# Patient Record
Sex: Male | Born: 1955 | Race: White | Hispanic: No | Marital: Married | Smoking: Current every day smoker
Health system: Southern US, Community
[De-identification: ages and names within clinical notes are randomized; demographics above are authoritative.]

## PROBLEM LIST (undated history)

## (undated) DIAGNOSIS — K08109 Complete loss of teeth, unspecified cause, unspecified class: Secondary | ICD-10-CM

## (undated) DIAGNOSIS — F17210 Nicotine dependence, cigarettes, uncomplicated: Secondary | ICD-10-CM

## (undated) DIAGNOSIS — H9190 Unspecified hearing loss, unspecified ear: Secondary | ICD-10-CM

## (undated) DIAGNOSIS — Z972 Presence of dental prosthetic device (complete) (partial): Secondary | ICD-10-CM

## (undated) DIAGNOSIS — C801 Malignant (primary) neoplasm, unspecified: Secondary | ICD-10-CM

## (undated) DIAGNOSIS — Z923 Personal history of irradiation: Secondary | ICD-10-CM

## (undated) HISTORY — PX: TONSILLECTOMY: SUR1361

## (undated) HISTORY — PX: MULTIPLE TOOTH EXTRACTIONS: SHX2053

---

## 2013-06-23 ENCOUNTER — Encounter (HOSPITAL_BASED_OUTPATIENT_CLINIC_OR_DEPARTMENT_OTHER): Payer: Self-pay | Admitting: *Deleted

## 2013-06-23 NOTE — Progress Notes (Signed)
Pt not sure if he can have surgery-needs to talk with wife about money-will call back

## 2013-06-25 ENCOUNTER — Ambulatory Visit (HOSPITAL_BASED_OUTPATIENT_CLINIC_OR_DEPARTMENT_OTHER): Payer: BC Managed Care – PPO | Admitting: Anesthesiology

## 2013-06-25 ENCOUNTER — Ambulatory Visit (HOSPITAL_BASED_OUTPATIENT_CLINIC_OR_DEPARTMENT_OTHER)
Admission: RE | Admit: 2013-06-25 | Discharge: 2013-06-25 | Disposition: A | Discharge status: Home / Self Care | Payer: BC Managed Care – PPO | Source: Ambulatory Visit | Attending: Otolaryngology | Admitting: Otolaryngology

## 2013-06-25 ENCOUNTER — Encounter (HOSPITAL_BASED_OUTPATIENT_CLINIC_OR_DEPARTMENT_OTHER)
Admission: RE | Disposition: A | Discharge status: Home / Self Care | Payer: Self-pay | Source: Ambulatory Visit | Attending: Otolaryngology

## 2013-06-25 ENCOUNTER — Encounter (HOSPITAL_BASED_OUTPATIENT_CLINIC_OR_DEPARTMENT_OTHER): Payer: BC Managed Care – PPO | Admitting: Anesthesiology

## 2013-06-25 ENCOUNTER — Encounter (HOSPITAL_BASED_OUTPATIENT_CLINIC_OR_DEPARTMENT_OTHER): Payer: Self-pay | Admitting: *Deleted

## 2013-06-25 DIAGNOSIS — J449 Chronic obstructive pulmonary disease, unspecified: Secondary | ICD-10-CM | POA: Insufficient documentation

## 2013-06-25 DIAGNOSIS — J4489 Other specified chronic obstructive pulmonary disease: Secondary | ICD-10-CM | POA: Insufficient documentation

## 2013-06-25 DIAGNOSIS — H9190 Unspecified hearing loss, unspecified ear: Secondary | ICD-10-CM | POA: Insufficient documentation

## 2013-06-25 DIAGNOSIS — C801 Malignant (primary) neoplasm, unspecified: Secondary | ICD-10-CM | POA: Insufficient documentation

## 2013-06-25 DIAGNOSIS — C320 Malignant neoplasm of glottis: Secondary | ICD-10-CM | POA: Insufficient documentation

## 2013-06-25 DIAGNOSIS — F17200 Nicotine dependence, unspecified, uncomplicated: Secondary | ICD-10-CM | POA: Insufficient documentation

## 2013-06-25 HISTORY — PX: MICROLARYNGOSCOPY: SHX5208

## 2013-06-25 HISTORY — DX: Nicotine dependence, cigarettes, uncomplicated: F17.210

## 2013-06-25 HISTORY — DX: Malignant (primary) neoplasm, unspecified: C80.1

## 2013-06-25 HISTORY — DX: Presence of dental prosthetic device (complete) (partial): K08.109

## 2013-06-25 HISTORY — DX: Unspecified hearing loss, unspecified ear: H91.90

## 2013-06-25 HISTORY — DX: Presence of dental prosthetic device (complete) (partial): Z97.2

## 2013-06-25 LAB — POCT HEMOGLOBIN-HEMACUE: Hemoglobin: 14.8 g/dL (ref 13.0–17.0)

## 2013-06-25 SURGERY — MICROLARYNGOSCOPY
Anesthesia: General | Site: Mouth | Laterality: Bilateral

## 2013-06-25 MED ORDER — FENTANYL CITRATE 0.05 MG/ML IJ SOLN: 25.0000 ug | INTRAMUSCULAR | Status: DC | PRN

## 2013-06-25 MED ORDER — METOCLOPRAMIDE HCL 5 MG/ML IJ SOLN: 10.0000 mg | Freq: Once | INTRAMUSCULAR | Status: DC | PRN

## 2013-06-25 MED ORDER — MIDAZOLAM HCL 2 MG/2ML IJ SOLN
INTRAMUSCULAR | Status: AC
  Filled 2013-06-25: qty 2

## 2013-06-25 MED ORDER — SUCCINYLCHOLINE CHLORIDE 20 MG/ML IJ SOLN
INTRAMUSCULAR | Status: AC
  Filled 2013-06-25: qty 1

## 2013-06-25 MED ORDER — EPINEPHRINE HCL 1 MG/ML IJ SOLN
INTRAMUSCULAR | Status: DC | PRN
  Administered 2013-06-25: 1 mg

## 2013-06-25 MED ORDER — ONDANSETRON HCL 4 MG/2ML IJ SOLN
INTRAMUSCULAR | Status: DC | PRN
  Administered 2013-06-25: 4 mg via INTRAVENOUS

## 2013-06-25 MED ORDER — DEXAMETHASONE SODIUM PHOSPHATE 4 MG/ML IJ SOLN
INTRAMUSCULAR | Status: DC | PRN
  Administered 2013-06-25: 10 mg via INTRAVENOUS

## 2013-06-25 MED ORDER — LACTATED RINGERS IV SOLN
INTRAVENOUS | Status: DC
  Administered 2013-06-25: 08:00:00 via INTRAVENOUS

## 2013-06-25 MED ORDER — OXYCODONE HCL 5 MG/5ML PO SOLN: 5.0000 mg | Freq: Once | ORAL | Status: DC | PRN

## 2013-06-25 MED ORDER — PROPOFOL 10 MG/ML IV BOLUS
INTRAVENOUS | Status: DC | PRN
  Administered 2013-06-25: 200 mg via INTRAVENOUS

## 2013-06-25 MED ORDER — LIDOCAINE HCL (CARDIAC) 20 MG/ML IV SOLN
INTRAVENOUS | Status: DC | PRN
  Administered 2013-06-25: 75 mg via INTRAVENOUS

## 2013-06-25 MED ORDER — SUCCINYLCHOLINE CHLORIDE 20 MG/ML IJ SOLN
INTRAMUSCULAR | Status: DC | PRN
  Administered 2013-06-25: 100 mg via INTRAVENOUS

## 2013-06-25 MED ORDER — PROPOFOL 10 MG/ML IV EMUL
INTRAVENOUS | Status: AC
  Filled 2013-06-25: qty 50

## 2013-06-25 MED ORDER — FENTANYL CITRATE 0.05 MG/ML IJ SOLN
INTRAMUSCULAR | Status: AC
  Filled 2013-06-25: qty 6

## 2013-06-25 MED ORDER — OXYCODONE HCL 5 MG PO TABS: 5.0000 mg | ORAL_TABLET | Freq: Once | ORAL | Status: DC | PRN

## 2013-06-25 MED ORDER — EPINEPHRINE HCL 1 MG/ML IJ SOLN
INTRAMUSCULAR | Status: AC
  Filled 2013-06-25: qty 1

## 2013-06-25 MED ORDER — MIDAZOLAM HCL 5 MG/5ML IJ SOLN
INTRAMUSCULAR | Status: DC | PRN
  Administered 2013-06-25: 1 mg via INTRAVENOUS

## 2013-06-25 MED ORDER — FENTANYL CITRATE 0.05 MG/ML IJ SOLN
INTRAMUSCULAR | Status: DC | PRN
  Administered 2013-06-25: 100 ug via INTRAVENOUS

## 2013-06-25 SURGICAL SUPPLY — 22 items
CANISTER SUCT 1200ML W/VALVE (MISCELLANEOUS) ×2 IMPLANT
CONT SPEC 4OZ CLIKSEAL STRL BL (MISCELLANEOUS) IMPLANT
DEPRESSOR TONGUE BLADE STERILE (MISCELLANEOUS) IMPLANT
FILTER 7/8 IN (FILTER) IMPLANT
GAUZE SPONGE 4X4 12PLY STRL LF (GAUZE/BANDAGES/DRESSINGS) ×2 IMPLANT
GLOVE SS BIOGEL STRL SZ 7.5 (GLOVE) ×1 IMPLANT
GLOVE SUPERSENSE BIOGEL SZ 7.5 (GLOVE) ×1
GLOVE SURG SS PI 7.0 STRL IVOR (GLOVE) ×2 IMPLANT
GOWN PREVENTION PLUS XLARGE (GOWN DISPOSABLE) ×4 IMPLANT
GOWN PREVENTION PLUS XXLARGE (GOWN DISPOSABLE) IMPLANT
GUARD TEETH (MISCELLANEOUS) ×2 IMPLANT
NEEDLE HYPO 18GX1.5 BLUNT FILL (NEEDLE) ×2 IMPLANT
NEEDLE SPNL 22GX7 QUINCKE BK (NEEDLE) IMPLANT
NS IRRIG 1000ML POUR BTL (IV SOLUTION) IMPLANT
PATTIES SURGICAL .5 X3 (DISPOSABLE) ×2 IMPLANT
REDUCTION FITTING 1/4 IN (FILTER) IMPLANT
SHEET MEDIUM DRAPE 40X70 STRL (DRAPES) ×2 IMPLANT
SURGILUBE 2OZ TUBE FLIPTOP (MISCELLANEOUS) IMPLANT
SYR 5ML LL (SYRINGE) ×2 IMPLANT
SYR CONTROL 10ML LL (SYRINGE) IMPLANT
TOWEL OR 17X24 6PK STRL BLUE (TOWEL DISPOSABLE) ×2 IMPLANT
TUBE CONNECTING 20X1/4 (TUBING) ×2 IMPLANT

## 2013-06-25 NOTE — Transfer of Care (Signed)
Immediate Anesthesia Transfer of Care Note  Patient: Joshua Reese  Procedure(s) Performed: Procedure(s): MICROLARYNGOSCOPY WITH BIOPSY  (Bilateral)  Patient Location: PACU  Anesthesia Type:General  Level of Consciousness: awake, alert  and oriented  Airway & Oxygen Therapy: Patient Spontanous Breathing and Patient connected to face mask oxygen  Post-op Assessment: Report given to PACU RN and Post -op Vital signs reviewed and stable  Post vital signs: Reviewed and stable  Complications: No apparent anesthesia complications

## 2013-06-25 NOTE — Anesthesia Preprocedure Evaluation (Signed)
Anesthesia Evaluation  Patient identified by MRN, date of birth, ID band Patient awake    Reviewed: Allergy & Precautions, H&P , NPO status , Patient's Chart, lab work & pertinent test results, reviewed documented beta blocker date and time   Airway Mallampati: II TM Distance: >3 FB Neck ROM: full    Dental   Pulmonary COPDCurrent Smoker,  breath sounds clear to auscultation        Cardiovascular negative cardio ROS  Rhythm:regular     Neuro/Psych negative neurological ROS  negative psych ROS   GI/Hepatic negative GI ROS, Neg liver ROS,   Endo/Other  negative endocrine ROS  Renal/GU negative Renal ROS  negative genitourinary   Musculoskeletal   Abdominal   Peds  Hematology negative hematology ROS (+)   Anesthesia Other Findings See surgeon's H&P   Reproductive/Obstetrics negative OB ROS                           Anesthesia Physical Anesthesia Plan  ASA: II  Anesthesia Plan: General   Post-op Pain Management:    Induction: Intravenous  Airway Management Planned: Oral ETT  Additional Equipment:   Intra-op Plan:   Post-operative Plan: Extubation in OR  Informed Consent: I have reviewed the patients History and Physical, chart, labs and discussed the procedure including the risks, benefits and alternatives for the proposed anesthesia with the patient or authorized representative who has indicated his/her understanding and acceptance.   Dental Advisory Given  Plan Discussed with: CRNA and Surgeon  Anesthesia Plan Comments:         Anesthesia Quick Evaluation

## 2013-06-25 NOTE — Anesthesia Postprocedure Evaluation (Signed)
Anesthesia Post Note  Patient: Joshua Reese  Procedure(s) Performed: Procedure(s) (LRB): MICROLARYNGOSCOPY WITH BIOPSY  (Bilateral)  Anesthesia type: General  Patient location: PACU  Post pain: Pain level controlled  Post assessment: Patient's Cardiovascular Status Stable  Last Vitals:  Filed Vitals:   06/25/13 1000  BP: 140/71  Pulse: 80  Temp: 36.4 C  Resp: 20    Post vital signs: Reviewed and stable  Level of consciousness: alert  Complications: No apparent anesthesia complications

## 2013-06-25 NOTE — Op Note (Signed)
Preop/postop diagnosis: Laryngeal lesion Procedure: Microlaryngoscopy with right vocal cord stripping and left vocal cord biopsy Anesthesia: Gen. Estimated blood loss: Less than 5 cc Indications: 57 year old with hoarseness and fiberoptic diagnosed lesions of both vocal cords. There is some exophytic appearance to the right vocal cord. Patient was informed risks, benefits, and options and all questions are answered and consent was obtained. Procedure: Patient was taken to the operating room placed in the supine position after general endotracheal tube anesthesia was placed in the rose position. The Dedo scope was inserted and the pharynx was evaluated. There didn't appear to be any lesions in the hypopharynx or post cricoid region. The larynx had very thickened vocal cords with the right side having some component of exophytic appearing tissue. 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 Right vocal cord was addressed with the micro-instrument grasped with grasping forceps and the straight scissors. The  lesion was stripped off of the right vocal cord. The underlying stroma and muscle was preserved. There was a small cyst as well underneath this lining that was removed. Good hemostasis was achieved with pledgets with ephedrine. The left leukoplakia was biopsied in the posterior mid cord region. This was the only location this was apparent. The subglottis looked without evidence of lesions. The patient was then awakened brought to recovery room in  stable condition counts correct

## 2013-06-25 NOTE — H&P (Signed)
Joshua Reese is an 57 y.o. male.   Chief Complaint: vocal cord lesion HPI: hx of hoarseness and lesion on vocal cord  Past Medical History  Diagnosis Date  . Heavy cigarette smoker   . Full dentures   . HOH (hard of hearing)     Past Surgical History  Procedure Laterality Date  . Tonsillectomy    . Multiple tooth extractions      History reviewed. No pertinent family history. Social History:  reports that he has been smoking.  He does not have any smokeless tobacco history on file. He reports that he drinks alcohol. He reports that he does not use illicit drugs.  Allergies: No Known Allergies  No prescriptions prior to admission    Results for orders placed during the hospital encounter of 06/25/13 (from the past 48 hour(s))  POCT HEMOGLOBIN-HEMACUE     Status: None   Collection Time    06/25/13  8:15 AM      Result Value Range   Hemoglobin 14.8  13.0 - 17.0 g/dL   No results found.  Review of Systems  Constitutional: Negative.   HENT: Negative.   Eyes: Negative.   Respiratory: Negative.   Cardiovascular: Negative.   Skin: Negative.     Blood pressure 150/91, pulse 63, temperature 97.7 F (36.5 C), temperature source Oral, resp. rate 16, height 6\' 1"  (1.854 m), weight 59.478 kg (131 lb 2 oz), SpO2 100.00%. Physical Exam  Constitutional: He appears well-developed.  HENT:  Nose: Nose normal.  Mouth/Throat: Oropharynx is clear and moist.  Eyes: Conjunctivae are normal.  Neck: Normal range of motion. Neck supple.  Cardiovascular: Normal rate.   Respiratory: Effort normal.  GI: Soft.     Assessment/Plan Vocal cord lesion- discussed microlaryngoscopy and ready to proceed  Suzanna Obey 06/25/2013, 8:24 AM

## 2013-06-26 ENCOUNTER — Encounter (HOSPITAL_BASED_OUTPATIENT_CLINIC_OR_DEPARTMENT_OTHER): Payer: Self-pay | Admitting: Otolaryngology

## 2013-07-13 ENCOUNTER — Encounter: Payer: Self-pay | Admitting: Radiation Oncology

## 2013-07-13 NOTE — Progress Notes (Addendum)
Head and Neck Cancer Location of Tumor / Histology: Vocal Cord Right   Patient presented months ago with symptoms of: Hoarseness   Biopsies of  (if applicable) revealed:  06/25/13: Diagnosis1. Vocal cord, biopsy, Right- INVASIVE SQUAMOUS CELL CARCINOMA.- SEE COMMENT.2. Vocal cord, biopsy, Left- SCANT FRAGMENTS OF SUPERFICIAL SQUAMOUS MUCOSA.- THERE IS NO EVIDENCE OF MALIGNANCY. Dr. Suzanna Obey, follow up  1-2 weeks,    Nutrition Status: able to eat whatever he wants.  Weight changes: has lost about 10 lb over a couple of months.  Has gained 5 lbs back.  Swallowing status: none  Plans, if any, for PEG tube: no  Tobacco/Marijuana/Snuff/ETOH use: smokes 1 1/4 pack per day.  Has smoked since he was 57 years old. 4-6 beers per day no illicit drug use or smokeless tobacco  Past/Anticipated interventions by otolaryngology, if any: 06/25/13 - Procedure: MICROLARYNGOSCOPY WITH BIOPSY ;  Surgeon: Suzanna Obey, MD;  Location: [facility];  Service: ENT;  Laterality: Bilateral;  Past/Anticipated interventions by medical oncology, if any: no  Referrals yet, to any of the following?  Social Work? no  Dentistry? no  Swallowing therapy? no  Nutrition? no  Med/Onc? no  PEG placement? no  SAFETY ISSUES:  Prior radiation? no  Pacemaker/ICD? no  Is the patient on methotrexate? no  Current Complaints / other details: Married, 3 children.  HOH, full dentures,  Denies pain.

## 2013-07-14 ENCOUNTER — Telehealth: Payer: Self-pay | Admitting: *Deleted

## 2013-07-14 NOTE — Telephone Encounter (Signed)
Called pt to introduce myself as the nurse navigator that works with Dr. Mitzi Hansen, briefly described my role as a member of the Care Team, and indicated and that I would be joining him during his appt.  I confirmed his 3:00 appt with the nurse, 3:30 appt witjh Dr. Mitzi Hansen, suggested he arrive at 2:45, confirmed his understanding of [facility] location.    Young Berry, RN, BSN, [facility] Head & Neck Oncology Navigator [phone]

## 2013-07-15 ENCOUNTER — Ambulatory Visit
Admission: RE | Admit: 2013-07-15 | Discharge: 2013-07-15 | Disposition: A | Discharge status: Home / Self Care | Payer: BC Managed Care – PPO | Source: Ambulatory Visit | Attending: Radiation Oncology | Admitting: Radiation Oncology

## 2013-07-15 ENCOUNTER — Encounter: Payer: Self-pay | Admitting: Radiation Oncology

## 2013-07-15 ENCOUNTER — Encounter: Payer: Self-pay | Admitting: *Deleted

## 2013-07-15 VITALS — BP 151/85 | HR 70 | Temp 97.9°F | Ht 73.0 in | Wt 136.0 lb

## 2013-07-15 DIAGNOSIS — C320 Malignant neoplasm of glottis: Secondary | ICD-10-CM | POA: Insufficient documentation

## 2013-07-15 DIAGNOSIS — H9190 Unspecified hearing loss, unspecified ear: Secondary | ICD-10-CM | POA: Insufficient documentation

## 2013-07-15 DIAGNOSIS — F17200 Nicotine dependence, unspecified, uncomplicated: Secondary | ICD-10-CM | POA: Insufficient documentation

## 2013-07-15 HISTORY — DX: Malignant (primary) neoplasm, unspecified: C80.1

## 2013-07-15 MED ORDER — LARYNGOSCOPY SOLUTION RAD-ONC
15.0000 mL | Freq: Once | TOPICAL | Status: AC
  Administered 2013-07-15: 15 mL via TOPICAL
  Filled 2013-07-15: qty 15

## 2013-07-15 NOTE — Progress Notes (Signed)
Met with patient and his wife during scheduled appt with Dr. Mitzi Hansen.  Further explained my role as his navigator, encouraged them to call me at any time with questions/concerns before and during tmts.  The indicated understanding.  Showed them SIM and LINAC tmt, explained procedure for registration, coming to RadOnc area, preparing for tmt.  Initiating navigation as L1 (new) patient with this encounter.  Young Berry, RN, BSN, [facility] Head & Neck Oncology Navigator [phone]

## 2013-07-15 NOTE — Progress Notes (Signed)
Please see the Nurse Progress Note in the MD Initial Consult Encounter for this patient. 

## 2013-07-19 DIAGNOSIS — C320 Malignant neoplasm of glottis: Secondary | ICD-10-CM | POA: Insufficient documentation

## 2013-07-19 NOTE — Progress Notes (Signed)
Radiation Oncology         [phone] ________________________________  Name: Joshua Reese MRN: [id number]  Date: 07/15/2013  DOB: 1956/05/19  CC:No PCP Per Patient  Melissa Montane, MD     REFERRING PHYSICIAN: Melissa Montane, MD   DIAGNOSIS: The primary encounter diagnosis was Vocal cord cancer. A diagnosis of Cancer, true vocal cords was also pertinent to this visit.   HISTORY OF PRESENT ILLNESS::Joshua Reese is a 58 y.o. male who is seen for an initial consultation visit. The patient indicates that his primary complaint was that of a change in voice/hoarseness. This has been present for a couple of months. This has been associated with an approximate 10 pound weight loss over this time but he has gained approximately 5 pounds back recently. He denies any other symptoms including dysphagia and odynophagia.  The patient was referred to Dr. Janace Hoard after conservative measures did not resolve this symptom. He was noted to have a lesion on the vocal cord on exam and he proceeded to undergo a fiberoptic exam with vocal cord stripping on the right and a biopsy of the left vocal cord on 06/25/2013. An exophytic appearance was present with regards to the right vocal cord. 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 The lesion was tripped off of the right vocal cord. 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 The biopsy of the left vocal cord showed scant fragments of superficial squamous mucosa. No evidence of malignancy seen on this side.  No new complaints recently. Given these findings I have been asked to see the patient today for consideration of definitive radiation treatment.   PREVIOUS RADIATION THERAPY: No   PAST MEDICAL HISTORY:  has a past medical history of Heavy cigarette smoker; Full dentures; HOH (hard of hearing); and Cancer (06/25/13).     PAST SURGICAL HISTORY: Past Surgical History  Procedure Laterality Date  . Tonsillectomy     . Multiple tooth extractions    . Microlaryngoscopy Bilateral 06/25/2013    Procedure: MICROLARYNGOSCOPY WITH BIOPSY ;  Surgeon: Melissa Montane, MD;  Location: [facility];  Service: ENT;  Laterality: Bilateral;     FAMILY HISTORY: family history is not on file.   SOCIAL HISTORY:  reports that he has been smoking.  He does not have any smokeless tobacco history on file. He reports that he drinks about 14.0 ounces of alcohol per week. He reports that he does not use illicit drugs.   ALLERGIES: Review of patient's allergies indicates no known allergies.   MEDICATIONS:  Current Outpatient Prescriptions  Medication Sig Dispense Refill  . Multiple Vitamin (MULTIVITAMIN) capsule Take 1 capsule by mouth daily.      Marland Kitchen NEXIUM 40 MG capsule Take 40 mg by mouth daily.       No current facility-administered medications for this encounter.     REVIEW OF SYSTEMS:  A 15 point review of systems is documented in the electronic medical record. This was obtained by the nursing staff. However, I reviewed this with the patient to discuss relevant findings and make appropriate changes.  Pertinent items are noted in HPI.    PHYSICAL EXAM:  height is 6\' 1"  (1.854 m) and weight is 136 lb (61.689 kg). His temperature is 97.9 F (36.6 C). His blood pressure is 151/85 and his pulse is 70. His oxygen saturation is 100%.   ECOG = 1  0 - Asymptomatic (Fully active, able to carry on all predisease activities  without restriction)  1 - Symptomatic but completely ambulatory (Restricted in physically strenuous activity but ambulatory and able to carry out work of a light or sedentary nature. For example, light housework, office work)  2 - Symptomatic, <50% in bed during the day (Ambulatory and capable of all self care but unable to carry out any work activities. Up and about more than 50% of waking hours)  3 - Symptomatic, >50% in bed, but not bedbound (Capable of only limited self-care, confined to  bed or chair 50% or more of waking hours)  4 - Bedbound (Completely disabled. Cannot carry on any self-care. Totally confined to bed or chair)  5 - Death   Eustace Pen MM, Creech RH, Tormey DC, et al. [phone]). "Toxicity and response criteria of the [facility]". [place] Oncol. 5 (6): 649-55  General: Well-developed, in no acute distress HEENT: Normocephalic, atraumatic; oral cavity clear Neck:  No palpable lymphadenopathy present Cardiovascular: Regular rate and rhythm Respiratory: Clear to auscultation bilaterally GI: Soft, nontender, normal bowel sounds Extremities: No edema present  Fiberoptic exam: After the use of topical anesthetic, a fiberoptic scope was passed in the left naris. Good visualization was obtained of the oropharynx/hypopharynx/larynx. Some fullness was present along the right vocal cord as well as some discoloration of the left vocal cord. Both vocal cords moved symmetrically. No other lesions seen on exam.   LABORATORY DATA:  Lab Results  Component Value Date   HGB 14.8 06/25/2013   No results found for this basename: NA, K, CL, CO2   No results found for this basename: ALT, AST, GGT, ALKPHOS, BILITOT      RADIOGRAPHY: No results found.     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 No clear malignancy seen on the left from biopsies of the contralateral vocal cord.  I believe that the patient is a good candidate for definitive radiotherapy for this lesion. He does not have any further staging studies pending and I discussed with him proceeding with a CT scan of the neck to ensure no regional disease although this is felt unlikely. I discussed with the patient a typical approximate 6 week course of treatment. We discussed the potential side effects and risks of treatment in detail. All of his questions were answered.  The patient does wish to proceed with radiation treatment at  this time.   PLAN: The patient will be scheduled for a CT scan of the neck as well as a simulation in our department such that we can proceed with treatment planning.    I spent 60 minutes face to face with the patient and more than 50% of that time was spent in counseling and/or coordination of care.    ________________________________   Jodelle Gross, MD, PhD

## 2013-07-20 ENCOUNTER — Telehealth: Payer: Self-pay | Admitting: *Deleted

## 2013-07-20 NOTE — Telephone Encounter (Signed)
Called patient to inform of test, spoke with patient and he is aware of this test. 

## 2013-07-21 ENCOUNTER — Ambulatory Visit (HOSPITAL_COMMUNITY)
Admission: RE | Admit: 2013-07-21 | Discharge: 2013-07-21 | Disposition: A | Discharge status: Home / Self Care | Payer: BC Managed Care – PPO | Source: Ambulatory Visit | Attending: Radiation Oncology | Admitting: Radiation Oncology

## 2013-07-21 DIAGNOSIS — M47812 Spondylosis without myelopathy or radiculopathy, cervical region: Secondary | ICD-10-CM | POA: Insufficient documentation

## 2013-07-21 DIAGNOSIS — C320 Malignant neoplasm of glottis: Secondary | ICD-10-CM

## 2013-07-21 MED ORDER — IOHEXOL 300 MG/ML  SOLN
100.0000 mL | Freq: Once | INTRAMUSCULAR | Status: AC | PRN
  Administered 2013-07-21: 100 mL via INTRAVENOUS

## 2013-07-22 ENCOUNTER — Ambulatory Visit
Admission: RE | Admit: 2013-07-22 | Discharge: 2013-07-22 | Disposition: A | Discharge status: Home / Self Care | Payer: BC Managed Care – PPO | Source: Ambulatory Visit | Attending: Radiation Oncology | Admitting: Radiation Oncology

## 2013-07-22 DIAGNOSIS — C320 Malignant neoplasm of glottis: Secondary | ICD-10-CM | POA: Insufficient documentation

## 2013-07-22 DIAGNOSIS — R1310 Dysphagia, unspecified: Secondary | ICD-10-CM | POA: Insufficient documentation

## 2013-07-22 DIAGNOSIS — J029 Acute pharyngitis, unspecified: Secondary | ICD-10-CM | POA: Insufficient documentation

## 2013-07-22 DIAGNOSIS — K1239 Other oral mucositis (ulcerative): Secondary | ICD-10-CM

## 2013-07-22 DIAGNOSIS — Z79899 Other long term (current) drug therapy: Secondary | ICD-10-CM | POA: Insufficient documentation

## 2013-07-22 DIAGNOSIS — Z510 Encounter for antineoplastic radiation therapy: Secondary | ICD-10-CM | POA: Insufficient documentation

## 2013-07-22 DIAGNOSIS — R52 Pain, unspecified: Secondary | ICD-10-CM | POA: Insufficient documentation

## 2013-07-22 DIAGNOSIS — L819 Disorder of pigmentation, unspecified: Secondary | ICD-10-CM | POA: Insufficient documentation

## 2013-07-22 DIAGNOSIS — L539 Erythematous condition, unspecified: Secondary | ICD-10-CM | POA: Insufficient documentation

## 2013-07-22 DIAGNOSIS — Y842 Radiological procedure and radiotherapy as the cause of abnormal reaction of the patient, or of later complication, without mention of misadventure at the time of the procedure: Secondary | ICD-10-CM | POA: Insufficient documentation

## 2013-07-22 DIAGNOSIS — R490 Dysphonia: Secondary | ICD-10-CM | POA: Insufficient documentation

## 2013-07-22 DIAGNOSIS — K121 Other forms of stomatitis: Secondary | ICD-10-CM | POA: Insufficient documentation

## 2013-07-23 NOTE — Progress Notes (Signed)
  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  Identity was confirmed.  All relevant records and images related to the planned course of therapy were reviewed.   Written consent to proceed with treatment was confirmed which was freely given after reviewing the details related to the planned course of therapy had been reviewed with the patient.  Then, the patient was set-up in a stable reproducible  supine position for radiation therapy.  CT images were obtained.  Surface markings were placed.    Medically necessary complex treatment device(s) for immobilization:  Customized thermoplastic head chest.   The CT images were loaded into the planning software.  Then the target and avoidance structures were contoured.  Treatment planning then occurred.  The radiation prescription was entered and confirmed.  A total of 2 complex treatment devices were fabricated which relate to the designed radiation treatment fields. Each of these customized fields/ complex treatment devices will be used on a daily basis during the radiation course. I have requested : Isodose Plan.   PLAN:  The patient will receive 63 Gy in 28 fractions.  ________________________________   Jodelle Gross, MD, PhD

## 2013-07-29 ENCOUNTER — Encounter: Payer: Self-pay | Admitting: *Deleted

## 2013-07-29 ENCOUNTER — Ambulatory Visit
Admission: RE | Admit: 2013-07-29 | Discharge: 2013-07-29 | Disposition: A | Discharge status: Home / Self Care | Payer: BC Managed Care – PPO | Source: Ambulatory Visit | Attending: Radiation Oncology | Admitting: Radiation Oncology

## 2013-07-29 DIAGNOSIS — C320 Malignant neoplasm of glottis: Secondary | ICD-10-CM

## 2013-07-29 NOTE — Progress Notes (Signed)
  Radiation Oncology         [id number]) [phone] ________________________________  Name: Joshua Reese MRN: [id number]  Date: 07/29/2013  DOB: [birth year]  Simulation Verification Note   NARRATIVE: The patient was brought to the treatment unit and placed in the planned treatment position. The clinical setup was verified. Then port films were obtained and uploaded to the radiation oncology medical record software.  The treatment beams were carefully compared against the planned radiation fields. The position, location, and shape of the radiation fields was reviewed. The targeted volume of tissue appears to be appropriately covered by the radiation beams. Based on my personal review, I approved the simulation verification. The patient's treatment will proceed as planned.  ________________________________   Jodelle Gross, MD, PhD

## 2013-07-29 NOTE — Progress Notes (Signed)
Met patient after initial RT to provide support.  Pt stated tmt scheduling working out well relative to job location and time needed.  He did not express any concerns/needs.  I encouraged him to contact me at any time.  He acknowledged understanding.  Continuing to navigate as L1 (new) patient.  Gayleen Orem, RN, BSN, [facility] Head & Neck Oncology Navigator [phone]

## 2013-07-30 ENCOUNTER — Ambulatory Visit
Admission: RE | Admit: 2013-07-30 | Discharge: 2013-07-30 | Disposition: A | Discharge status: Home / Self Care | Payer: BC Managed Care – PPO | Source: Ambulatory Visit | Attending: Radiation Oncology | Admitting: Radiation Oncology

## 2013-07-31 ENCOUNTER — Ambulatory Visit
Admission: RE | Admit: 2013-07-31 | Discharge: 2013-07-31 | Disposition: A | Discharge status: Home / Self Care | Payer: BC Managed Care – PPO | Source: Ambulatory Visit | Attending: Radiation Oncology | Admitting: Radiation Oncology

## 2013-07-31 ENCOUNTER — Telehealth: Payer: Self-pay | Admitting: *Deleted

## 2013-07-31 VITALS — BP 128/69 | HR 64 | Temp 97.4°F | Ht 73.0 in | Wt 136.9 lb

## 2013-07-31 DIAGNOSIS — C320 Malignant neoplasm of glottis: Secondary | ICD-10-CM

## 2013-07-31 MED ORDER — BIAFINE EX EMUL
Freq: Two times a day (BID) | CUTANEOUS | Status: DC
  Administered 2013-07-31: 09:00:00 via TOPICAL

## 2013-07-31 NOTE — Progress Notes (Signed)
   Department of Radiation Oncology  Phone:  [phone] Fax:        [phone]  Weekly Treatment Note    Name: Joshua Reese Date: 07/31/2013 MRN: [id number] DOB: Mar 16, 1956   Current dose: 5.5 Gy  Current fraction: 2   MEDICATIONS: Current Outpatient Prescriptions  Medication Sig Dispense Refill  . emollient (BIAFINE) cream Apply topically 2 (two) times daily.      . Multiple Vitamin (MULTIVITAMIN) capsule Take 1 capsule by mouth daily.      Marland Kitchen NEXIUM 40 MG capsule Take 40 mg by mouth daily.       Current Facility-Administered Medications  Medication Dose Route Frequency Provider Last Rate Last Dose  . topical emolient (BIAFINE) emulsion   Topical BID Marye Round, MD         ALLERGIES: Review of patient's allergies indicates no known allergies.   LABORATORY DATA:  Lab Results  Component Value Date   HGB 14.8 06/25/2013   No results found for this basename: NA, K, CL, CO2   No results found for this basename: ALT, AST, GGT, ALKPHOS, BILITOT     NARRATIVE: ADVAITH LAMARQUE was seen today for weekly treatment management. The chart was checked and the patient's films were reviewed. The patient is doing well in his first week of treatment. No complaints of far.  PHYSICAL EXAMINATION: height is 6\' 1"  (1.854 m) and weight is 136 lb 14.4 oz (62.097 kg). His temperature is 97.4 F (36.3 C). His blood pressure is 128/69 and his pulse is 64. His oxygen saturation is 96%.      no skin change  ASSESSMENT: The patient is doing satisfactorily with treatment.  PLAN: We will continue with the patient's radiation treatment as planned.

## 2013-07-31 NOTE — Progress Notes (Addendum)
Joshua Reese has had 2 fractions to his larynx.  He denies pain, sore throat and dry mouth.  His skin is intact.  Hoarse voice noted.  He was given the radiation therapy and you and the head and neck education handout.  Discussed side effects including fatigue, hair loss, mouth changes, pain and throat changes.  He was given biafine cream and was instructed to use it twice a day after treatment and at bedtime.  He was advised to contact nursing with any questions or concerns.

## 2013-07-31 NOTE — Telephone Encounter (Signed)
CALLED PATIENT TO INFORM OF NUTRITION APPT. ON 08-04-13, LVM FOR A RETURN CALL

## 2013-08-03 ENCOUNTER — Ambulatory Visit
Admission: RE | Admit: 2013-08-03 | Discharge: 2013-08-03 | Disposition: A | Discharge status: Home / Self Care | Payer: BC Managed Care – PPO | Source: Ambulatory Visit | Attending: Radiation Oncology | Admitting: Radiation Oncology

## 2013-08-03 ENCOUNTER — Encounter: Payer: BC Managed Care – PPO | Admitting: Nutrition

## 2013-08-04 ENCOUNTER — Encounter: Payer: BC Managed Care – PPO | Admitting: Nutrition

## 2013-08-04 ENCOUNTER — Encounter: Payer: Self-pay | Admitting: Nutrition

## 2013-08-04 ENCOUNTER — Ambulatory Visit
Admission: RE | Admit: 2013-08-04 | Discharge: 2013-08-04 | Disposition: A | Discharge status: Home / Self Care | Payer: BC Managed Care – PPO | Source: Ambulatory Visit | Attending: Radiation Oncology | Admitting: Radiation Oncology

## 2013-08-04 NOTE — Progress Notes (Signed)
Patient did not show up for scheduled nutrition appointment. 

## 2013-08-05 ENCOUNTER — Ambulatory Visit
Admission: RE | Admit: 2013-08-05 | Discharge: 2013-08-05 | Disposition: A | Discharge status: Home / Self Care | Payer: BC Managed Care – PPO | Source: Ambulatory Visit | Attending: Radiation Oncology | Admitting: Radiation Oncology

## 2013-08-06 ENCOUNTER — Ambulatory Visit
Admission: RE | Admit: 2013-08-06 | Discharge: 2013-08-06 | Disposition: A | Discharge status: Home / Self Care | Payer: BC Managed Care – PPO | Source: Ambulatory Visit | Attending: Radiation Oncology | Admitting: Radiation Oncology

## 2013-08-07 ENCOUNTER — Ambulatory Visit
Admission: RE | Admit: 2013-08-07 | Discharge: 2013-08-07 | Disposition: A | Discharge status: Home / Self Care | Payer: BC Managed Care – PPO | Source: Ambulatory Visit | Attending: Radiation Oncology | Admitting: Radiation Oncology

## 2013-08-07 ENCOUNTER — Ambulatory Visit: Payer: BC Managed Care – PPO | Admitting: Radiation Oncology

## 2013-08-10 ENCOUNTER — Ambulatory Visit
Admission: RE | Admit: 2013-08-10 | Discharge: 2013-08-10 | Disposition: A | Discharge status: Home / Self Care | Payer: BC Managed Care – PPO | Source: Ambulatory Visit | Attending: Radiation Oncology | Admitting: Radiation Oncology

## 2013-08-11 ENCOUNTER — Ambulatory Visit
Admission: RE | Admit: 2013-08-11 | Discharge: 2013-08-11 | Disposition: A | Discharge status: Home / Self Care | Payer: BC Managed Care – PPO | Source: Ambulatory Visit | Attending: Radiation Oncology | Admitting: Radiation Oncology

## 2013-08-12 ENCOUNTER — Telehealth: Payer: Self-pay | Admitting: *Deleted

## 2013-08-12 ENCOUNTER — Other Ambulatory Visit: Payer: Self-pay | Admitting: Radiation Oncology

## 2013-08-12 ENCOUNTER — Ambulatory Visit
Admission: RE | Admit: 2013-08-12 | Discharge: 2013-08-12 | Disposition: A | Discharge status: Home / Self Care | Payer: BC Managed Care – PPO | Source: Ambulatory Visit | Attending: Radiation Oncology | Admitting: Radiation Oncology

## 2013-08-12 ENCOUNTER — Encounter: Payer: Self-pay | Admitting: Radiation Oncology

## 2013-08-12 VITALS — BP 138/69 | HR 64 | Temp 99.0°F | Resp 20 | Wt 137.1 lb

## 2013-08-12 DIAGNOSIS — C320 Malignant neoplasm of glottis: Secondary | ICD-10-CM

## 2013-08-12 MED ORDER — SUCRALFATE 1 GM/10ML PO SUSP: 1.0000 g | Freq: Three times a day (TID) | ORAL | Status: DC

## 2013-08-12 NOTE — Telephone Encounter (Signed)
Called patient to find out what pharmacy patient uses, [facility], "will call  In RX carafte, patient to pick up later today, [facility] at [phone] with the pharmacist Beverely Low, rx, Carafate :1Gm/47ml suspension, take 59ml by mouth 4x day with meals and at bedtime, dispense 445ml with 3 refills per Dr.Murray,  9:18 AM

## 2013-08-12 NOTE — Progress Notes (Signed)
Weekly Management Note:  Site: Glottic larynx Current Dose:  2250  cGy Projected Dose: 6300  cGy  Narrative: The patient is seen today for routine under treatment assessment. CBCT/MVCT images/port films were reviewed. The chart was reviewed.   He seen today for worsening odynophagia. He rates his pain as being 4/10. He would prefer not to take any pain medication.  Physical Examination:  Filed Vitals:   08/12/13 0819  BP: 138/69  Pulse: 64  Temp: 99 F (37.2 C)  Resp: 20  .  Weight: 137 lb 1.6 oz (62.188 kg). Oral cavity and oropharynx unremarkable to inspection. There is no candidiasis. There is no palpable neck adenopathy.  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 Since he would like to avoid pain medication, I will start him on Carafate slurry to use when necessary.  Plan: Continue radiation therapy as planned.

## 2013-08-12 NOTE — Progress Notes (Signed)
Weekly rad tx larynx, 10/28 completed, slight  Erythema on neck,using biafine daily after radt tx, uses listerine gargling at night, hoarseness, sore throat on left side , swallowing difficulty, eating soft foods  no nausea, energy level  Good 8:21 AM

## 2013-08-12 NOTE — Telephone Encounter (Signed)
Tried to add [facility] [place]in patients demographics

## 2013-08-13 ENCOUNTER — Encounter: Payer: Self-pay | Admitting: *Deleted

## 2013-08-13 ENCOUNTER — Ambulatory Visit
Admission: RE | Admit: 2013-08-13 | Discharge: 2013-08-13 | Disposition: A | Discharge status: Home / Self Care | Payer: BC Managed Care – PPO | Source: Ambulatory Visit | Attending: Radiation Oncology | Admitting: Radiation Oncology

## 2013-08-13 ENCOUNTER — Other Ambulatory Visit: Payer: Self-pay | Admitting: Radiation Oncology

## 2013-08-13 MED ORDER — MAGIC MOUTHWASH W/LIDOCAINE: 5.0000 mL | Freq: Four times a day (QID) | ORAL | Status: DC | PRN

## 2013-08-13 NOTE — Progress Notes (Signed)
Met with patient before scheduled RT.  He indicated he is having increased pain with swallowing, using Listerine and Chloraseptic for relief.  I  Counseled him that the alcohol content of these products can increase irritation.  He verbalized understanding.   I spoke with Dr. Lisbeth Renshaw who submitted Rx for Magic Mouthwash to patient's pharmacy, I informed patient after his tmt.  Continuing to navigate as L1 (new) patient.  Gayleen Orem, RN, BSN, [facility] Head & Neck Oncology Navigator [phone]

## 2013-08-14 ENCOUNTER — Encounter: Payer: Self-pay | Admitting: Radiation Oncology

## 2013-08-14 ENCOUNTER — Ambulatory Visit: Payer: BC Managed Care – PPO | Admitting: Radiation Oncology

## 2013-08-14 ENCOUNTER — Ambulatory Visit: Payer: BC Managed Care – PPO | Admitting: Nutrition

## 2013-08-14 ENCOUNTER — Ambulatory Visit
Admission: RE | Admit: 2013-08-14 | Discharge: 2013-08-14 | Disposition: A | Discharge status: Home / Self Care | Payer: BC Managed Care – PPO | Source: Ambulatory Visit | Attending: Radiation Oncology | Admitting: Radiation Oncology

## 2013-08-14 VITALS — BP 136/81 | HR 60 | Temp 98.0°F | Resp 20 | Wt 134.6 lb

## 2013-08-14 DIAGNOSIS — C320 Malignant neoplasm of glottis: Secondary | ICD-10-CM

## 2013-08-14 NOTE — Progress Notes (Signed)
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  He is a patient of Dr. Lisbeth Renshaw.  Past medical history includes tobacco, hard of hearing, and dentures.  Medications include, multivitamin, Nexium, and Biafine.  Labs were reviewed.  Height: 6 feet 1 inch. Weight: 134.6 pounds. BMI: 17.76.    Patient reports his main complaint is painful swallowing.  He reports, "it feels as if a ping-pong ball with tacks in it is in my throat".  Patient reports no difference with liquids or solids.  He hasn't noticed any food or liquid temperature change is easier or more difficult.  He states Magic mouthwash has helped.  Patient has been refusing pain medications and really does not want to start taking medication for this issue.  Patient denies other nutrition related issues.  Nutrition diagnosis: Swallowing difficulty, related to diagnosis of vocal cord cancer and associated radiation therapy.  As evidenced by dysphasia and 2.5 pound weight loss.  Intervention: Patient was educated to consume softer, moister foods to ease swallowing.  Patient was educated to try different textures and temperatures of foods.  He was educated on foods that may potentially cause more discomfort such as high acid foods or carbonation.  Recommended patient begin oral nutrition supplements such as ensure or Carnation breakfast essentials.  Questions were answered.  Teach back method used.  Monitoring, evaluation, goals: Patient will tolerate increased calories and protein to minimize further weight loss.  Next visit: Patient prefers to call me for follow questions.

## 2013-08-14 NOTE — Progress Notes (Signed)
   Department of Radiation Oncology  Phone:  [phone] Fax:        [phone]  Weekly Treatment Note    Name: [name] Date: 08/14/2013 MRN: [id number] DOB: 03-26-1956   Current dose: 27 Gy  Current fraction: 12   MEDICATIONS: Current Outpatient Prescriptions  Medication Sig Dispense Refill  . Alum & Mag Hydroxide-Simeth (MAGIC MOUTHWASH W/LIDOCAINE) SOLN Take 5 mLs by mouth 4 (four) times daily as needed for mouth pain.  250 mL  1  . emollient (BIAFINE) cream Apply topically 2 (two) times daily.      . Multiple Vitamin (MULTIVITAMIN) capsule Take 1 capsule by mouth daily.      . sucralfate (CARAFATE) 1 GM/10ML suspension Take 10 mLs (1 g total) by mouth 4 (four) times daily -  with meals and at bedtime.  420 mL  3   No current facility-administered medications for this encounter.     ALLERGIES: Review of patient's allergies indicates no known allergies.   LABORATORY DATA:  Lab Results  Component Value Date   HGB 14.8 06/25/2013   No results found for this basename: NA, K, CL, CO2   No results found for this basename: ALT, AST, GGT, ALKPHOS, BILITOT     NARRATIVE: Joshua Reese was seen today for weekly treatment management. The chart was checked and the patient's films were reviewed. The patient is complaining of some increased sore throat. He began Carafate several days ago and he was given Magic mouth once yesterday which he does believe is helping more.  PHYSICAL EXAMINATION: weight is 134 lb 9.6 oz (61.054 kg). His oral temperature is 98 F (36.7 C). His blood pressure is 136/81 and his pulse is 60. His respiration is 20.      the patient's skin shows hyperpigmentation and developing erythema. No desquamation.  ASSESSMENT: The patient is doing satisfactorily with treatment.  PLAN: We will continue with the patient's radiation treatment as planned. We will use the above medicines and let us know if his pain worsens.

## 2013-08-14 NOTE — Progress Notes (Signed)
Weekly rad txs larynx, 12 txs , difficulty swallowing, started mmw yesterday, carafate used once, encouraged to use arafate 4x day, feels like swallowing a golf ball, hoarseness, no c/o nausa, pain sore throat 8:29 AM

## 2013-08-17 ENCOUNTER — Ambulatory Visit
Admission: RE | Admit: 2013-08-17 | Discharge: 2013-08-17 | Disposition: A | Discharge status: Home / Self Care | Payer: BC Managed Care – PPO | Source: Ambulatory Visit | Attending: Radiation Oncology | Admitting: Radiation Oncology

## 2013-08-18 ENCOUNTER — Ambulatory Visit
Admission: RE | Admit: 2013-08-18 | Discharge: 2013-08-18 | Disposition: A | Discharge status: Home / Self Care | Payer: BC Managed Care – PPO | Source: Ambulatory Visit | Attending: Radiation Oncology | Admitting: Radiation Oncology

## 2013-08-19 ENCOUNTER — Ambulatory Visit
Admission: RE | Admit: 2013-08-19 | Payer: BC Managed Care – PPO | Source: Ambulatory Visit | Admitting: Radiation Oncology

## 2013-08-19 ENCOUNTER — Ambulatory Visit
Admission: RE | Admit: 2013-08-19 | Discharge: 2013-08-19 | Disposition: A | Discharge status: Home / Self Care | Payer: BC Managed Care – PPO | Source: Ambulatory Visit | Attending: Radiation Oncology | Admitting: Radiation Oncology

## 2013-08-20 ENCOUNTER — Ambulatory Visit
Admission: RE | Admit: 2013-08-20 | Discharge: 2013-08-20 | Disposition: A | Discharge status: Home / Self Care | Payer: BC Managed Care – PPO | Source: Ambulatory Visit | Attending: Radiation Oncology | Admitting: Radiation Oncology

## 2013-08-21 ENCOUNTER — Ambulatory Visit
Admission: RE | Admit: 2013-08-21 | Discharge: 2013-08-21 | Disposition: A | Discharge status: Home / Self Care | Payer: BC Managed Care – PPO | Source: Ambulatory Visit | Attending: Radiation Oncology | Admitting: Radiation Oncology

## 2013-08-21 ENCOUNTER — Encounter: Payer: Self-pay | Admitting: Radiation Oncology

## 2013-08-21 ENCOUNTER — Encounter: Payer: Self-pay | Admitting: *Deleted

## 2013-08-21 VITALS — BP 152/83 | HR 62 | Temp 97.7°F | Resp 20 | Wt 135.2 lb

## 2013-08-21 DIAGNOSIS — C320 Malignant neoplasm of glottis: Secondary | ICD-10-CM

## 2013-08-21 NOTE — Progress Notes (Signed)
Pt states he has throat soreness w/swallwoing, but MMW is very helpful. He denies loss of appetite, is able to eat all foods. He denies fatigue. He is applying Biafine to neck treatment area for hyperpigmentation.

## 2013-08-21 NOTE — Progress Notes (Signed)
   Department of Radiation Oncology  Phone:  [phone] Fax:        [phone]  Weekly Treatment Note    Name: Joshua Reese Date: 08/21/2013 MRN: [id number] DOB: 08-08-1955   Current dose: 38.25 Gy  Current fraction: 17   MEDICATIONS: Current Outpatient Prescriptions  Medication Sig Dispense Refill  . Alum & Mag Hydroxide-Simeth (MAGIC MOUTHWASH W/LIDOCAINE) SOLN Take 5 mLs by mouth 4 (four) times daily as needed for mouth pain.  250 mL  1  . emollient (BIAFINE) cream Apply topically 2 (two) times daily.      . Multiple Vitamin (MULTIVITAMIN) capsule Take 1 capsule by mouth daily.      . sucralfate (CARAFATE) 1 GM/10ML suspension Take 10 mLs (1 g total) by mouth 4 (four) times daily -  with meals and at bedtime.  420 mL  3   No current facility-administered medications for this encounter.     ALLERGIES: Review of patient's allergies indicates no known allergies.   LABORATORY DATA:  Lab Results  Component Value Date   HGB 14.8 06/25/2013   No results found for this basename: NA, K, CL, CO2   No results found for this basename: ALT, AST, GGT, ALKPHOS, BILITOT     NARRATIVE: Joshua Reese was seen today for weekly treatment management. The chart was checked and the patient's films were reviewed. The patient is doing fairly well with continued stable symptoms. One changes in hoarseness which began earlier this week. The Magic mouthwash is working well. He takes aspirin on an occasional basis.  PHYSICAL EXAMINATION: weight is 135 lb 3.2 oz (61.326 kg). His oral temperature is 97.7 F (36.5 C). His blood pressure is 152/83 and his pulse is 62. His respiration is 20.      erythema emerging to a greater degree in the treatment area. His skin looks good at this point. No desquamation.  ASSESSMENT: The patient is doing satisfactorily with treatment.  PLAN: We will continue with the patient's radiation treatment as planned.

## 2013-08-21 NOTE — Progress Notes (Signed)
To provide support and encouragement, met with pt after RT and during UT appt with Dr. Lisbeth Renshaw.  Pt stated the MMW is helping him with throat pain; in addition he is taking 2 aspirin at bedtime for further relief.  In response to his expressed concern "My voice is gone", he was assured by Dr. Lisbeth Renshaw and myself that this is a normal SE at this point in his tmts.  He expressed understanding.  Continuing to navigate as L2 (treatments established) patient.  Gayleen Orem, RN, BSN, [facility] Head & Neck Oncology Navigator [phone]

## 2013-08-24 ENCOUNTER — Ambulatory Visit
Admission: RE | Admit: 2013-08-24 | Discharge: 2013-08-24 | Disposition: A | Discharge status: Home / Self Care | Payer: BC Managed Care – PPO | Source: Ambulatory Visit | Attending: Radiation Oncology | Admitting: Radiation Oncology

## 2013-08-25 ENCOUNTER — Ambulatory Visit
Admission: RE | Admit: 2013-08-25 | Discharge: 2013-08-25 | Disposition: A | Discharge status: Home / Self Care | Payer: BC Managed Care – PPO | Source: Ambulatory Visit | Attending: Radiation Oncology | Admitting: Radiation Oncology

## 2013-08-25 ENCOUNTER — Telehealth: Payer: Self-pay | Admitting: *Deleted

## 2013-08-25 NOTE — Telephone Encounter (Signed)
Called [facility] [phone], spoke with pharmacist Jenny Reichmann, per patient, "he tried to pick up refill on his mmw yesterday but was told it was too early" Pharmacist stated it would be taken care of today and patient can pick up refill, called Mr.Dierolf [phone], and informed him he will be able to pick up rx today,patient thanked this RN for taking care of this so soon 8:51 AM

## 2013-08-26 ENCOUNTER — Ambulatory Visit
Admission: RE | Admit: 2013-08-26 | Discharge: 2013-08-26 | Disposition: A | Discharge status: Home / Self Care | Payer: BC Managed Care – PPO | Source: Ambulatory Visit | Attending: Radiation Oncology | Admitting: Radiation Oncology

## 2013-08-27 ENCOUNTER — Ambulatory Visit
Admission: RE | Admit: 2013-08-27 | Discharge: 2013-08-27 | Disposition: A | Discharge status: Home / Self Care | Payer: BC Managed Care – PPO | Source: Ambulatory Visit | Attending: Radiation Oncology | Admitting: Radiation Oncology

## 2013-08-28 ENCOUNTER — Ambulatory Visit
Admission: RE | Admit: 2013-08-28 | Discharge: 2013-08-28 | Disposition: A | Discharge status: Home / Self Care | Payer: BC Managed Care – PPO | Source: Ambulatory Visit | Attending: Radiation Oncology | Admitting: Radiation Oncology

## 2013-08-28 ENCOUNTER — Encounter: Payer: Self-pay | Admitting: *Deleted

## 2013-08-28 VITALS — BP 107/64 | HR 68 | Temp 97.5°F | Ht 73.0 in | Wt 134.6 lb

## 2013-08-28 DIAGNOSIS — C320 Malignant neoplasm of glottis: Secondary | ICD-10-CM

## 2013-08-28 NOTE — Progress Notes (Signed)
To provide support and care continuity, met with patient during PUT appt with Dr. Lisbeth Renshaw.  Patient denied any needs/concerns.  He stated he has 6 tmts remaining.  Continuing navigation as L2 patient (treatments established).  Gayleen Orem, RN, BSN, [facility] Head & Neck Oncology Navigator [phone]

## 2013-08-28 NOTE — Progress Notes (Signed)
   Department of Radiation Oncology  Phone:  [phone] Fax:        [phone]  Weekly Treatment Note    Name: Joshua Reese Date: 08/28/2013 MRN: [id number] DOB: 02/02/1956   Current dose: 49.50 Gy  Current fraction: 22   MEDICATIONS: Current Outpatient Prescriptions  Medication Sig Dispense Refill  . Alum & Mag Hydroxide-Simeth (MAGIC MOUTHWASH W/LIDOCAINE) SOLN Take 5 mLs by mouth 4 (four) times daily as needed for mouth pain.  250 mL  1  . emollient (BIAFINE) cream Apply topically 2 (two) times daily.      . Multiple Vitamin (MULTIVITAMIN) capsule Take 1 capsule by mouth daily.      . sucralfate (CARAFATE) 1 GM/10ML suspension Take 10 mLs (1 g total) by mouth 4 (four) times daily -  with meals and at bedtime.  420 mL  3   No current facility-administered medications for this encounter.     ALLERGIES: Review of patient's allergies indicates no known allergies.   LABORATORY DATA:  Lab Results  Component Value Date   HGB 14.8 06/25/2013   No results found for this basename: NA, K, CL, CO2   No results found for this basename: ALT, AST, GGT, ALKPHOS, BILITOT     NARRATIVE: Joshua Reese was seen today for weekly treatment management. The chart was checked and the patient's films were reviewed. The patient complains of continued, somewhat increased soreness of the throat. He states his pain overall has been fairly stable. He has lost 1 pound this past week. The patient states that his current pain medication regimen is adequate.  PHYSICAL EXAMINATION: height is 6\' 1"  (1.854 m) and weight is 134 lb 9.6 oz (61.054 kg). His temperature is 97.5 F (36.4 C). His blood pressure is 107/64 and his pulse is 68. His oxygen saturation is 100%.      erythema/hyperpigmentation in the treatment area. No desquamation.  ASSESSMENT: The patient is doing satisfactorily with treatment.  PLAN: We will continue with the patient's radiation treatment as planned. The patient's  skin looks satisfactorily at this time. He will continue skin care.

## 2013-08-28 NOTE — Progress Notes (Signed)
Joshua Reese has had 22 fractions to his larynx.  He has pain when swallowing that he rates at a 5/10.  He says when he swallows it feels like the food has to go around a golf ball.  He is able to eat what he wants .  He has lost 1 lb since last week.  He has a dry mouth at night when sleeping.  His oral mucosa is intact.  He denies a cough.  The skin on his neck is red.  He is using biafine BID.  He denies fatigue.

## 2013-08-31 ENCOUNTER — Ambulatory Visit
Admission: RE | Admit: 2013-08-31 | Discharge: 2013-08-31 | Disposition: A | Discharge status: Home / Self Care | Payer: BC Managed Care – PPO | Source: Ambulatory Visit | Attending: Radiation Oncology | Admitting: Radiation Oncology

## 2013-09-01 ENCOUNTER — Ambulatory Visit: Payer: BC Managed Care – PPO

## 2013-09-02 ENCOUNTER — Ambulatory Visit
Admission: RE | Admit: 2013-09-02 | Discharge: 2013-09-02 | Disposition: A | Discharge status: Home / Self Care | Payer: BC Managed Care – PPO | Source: Ambulatory Visit | Attending: Radiation Oncology | Admitting: Radiation Oncology

## 2013-09-03 ENCOUNTER — Ambulatory Visit
Admission: RE | Admit: 2013-09-03 | Discharge: 2013-09-03 | Disposition: A | Discharge status: Home / Self Care | Payer: BC Managed Care – PPO | Source: Ambulatory Visit | Attending: Radiation Oncology | Admitting: Radiation Oncology

## 2013-09-04 ENCOUNTER — Encounter: Payer: Self-pay | Admitting: Radiation Oncology

## 2013-09-04 ENCOUNTER — Encounter: Payer: Self-pay | Admitting: *Deleted

## 2013-09-04 ENCOUNTER — Ambulatory Visit
Admission: RE | Admit: 2013-09-04 | Discharge: 2013-09-04 | Disposition: A | Discharge status: Home / Self Care | Payer: BC Managed Care – PPO | Source: Ambulatory Visit | Attending: Radiation Oncology | Admitting: Radiation Oncology

## 2013-09-04 VITALS — BP 150/85 | HR 66 | Temp 97.7°F | Resp 20 | Wt 134.9 lb

## 2013-09-04 DIAGNOSIS — C320 Malignant neoplasm of glottis: Secondary | ICD-10-CM

## 2013-09-04 NOTE — Progress Notes (Signed)
   Department of Radiation Oncology  Phone:  [phone] Fax:        [phone]  Weekly Treatment Note    Name: Joshua Reese Date: 09/04/2013 MRN: [id number] DOB: 12/07/1955   Current dose: 58.5 Gy  Current fraction: 26   MEDICATIONS: Current Outpatient Prescriptions  Medication Sig Dispense Refill  . Alum & Mag Hydroxide-Simeth (MAGIC MOUTHWASH W/LIDOCAINE) SOLN Take 5 mLs by mouth 4 (four) times daily as needed for mouth pain.  250 mL  1  . emollient (BIAFINE) cream Apply topically 2 (two) times daily.      . Multiple Vitamin (MULTIVITAMIN) capsule Take 1 capsule by mouth daily.      . sucralfate (CARAFATE) 1 GM/10ML suspension Take 10 mLs (1 g total) by mouth 4 (four) times daily -  with meals and at bedtime.  420 mL  3   No current facility-administered medications for this encounter.     ALLERGIES: Review of patient's allergies indicates no known allergies.   LABORATORY DATA:  Lab Results  Component Value Date   HGB 14.8 06/25/2013   No results found for this basename: NA, K, CL, CO2   No results found for this basename: ALT, AST, GGT, ALKPHOS, BILITOT     NARRATIVE: Joshua Reese was seen today for weekly treatment management. The chart was checked and the patient's films were reviewed. The patient is doing satisfactorily, clinically stable over the last week. Continued sore throat but this has not gotten progressively worse to a significant degree. Continued hoarseness and skin change.  PHYSICAL EXAMINATION: weight is 134 lb 14.4 oz (61.19 kg). His oral temperature is 97.7 F (36.5 C). His blood pressure is 150/85 and his pulse is 66. His respiration is 20.      erythema and dry desquamation in the neck. No moist desquamation.  ASSESSMENT: The patient is doing satisfactorily with treatment.  PLAN: We will continue with the patient's radiation treatment as planned. I will see the patient back in one month for followup. He knows to contact our office  sooner if needed.

## 2013-09-04 NOTE — Progress Notes (Signed)
Weekly rad txs,26/28 larynx,  dry desqamation and skin flaky, erythema, no moistness, throat still sore, difficulty swallowing some foods, thick saliva, hoarseness, almost out of MMW and Carafate, wants to wait on any refills, drinks protein shakes 1-2 cans a day, uses biafine cream 2-3x daily,gave f/u appt card for patient  8:23 AM

## 2013-09-07 ENCOUNTER — Telehealth: Payer: Self-pay | Admitting: Oncology

## 2013-09-07 ENCOUNTER — Ambulatory Visit
Admission: RE | Admit: 2013-09-07 | Discharge: 2013-09-07 | Disposition: A | Discharge status: Home / Self Care | Payer: BC Managed Care – PPO | Source: Ambulatory Visit | Attending: Radiation Oncology | Admitting: Radiation Oncology

## 2013-09-07 ENCOUNTER — Ambulatory Visit: Payer: BC Managed Care – PPO

## 2013-09-07 NOTE — Telephone Encounter (Signed)
Called and left a message for Carlie to call back to see if he needs a refill on magic mouthwash or Carafate.

## 2013-09-08 ENCOUNTER — Encounter: Payer: Self-pay | Admitting: Radiation Oncology

## 2013-09-08 ENCOUNTER — Ambulatory Visit
Admission: RE | Admit: 2013-09-08 | Discharge: 2013-09-08 | Disposition: A | Discharge status: Home / Self Care | Payer: BC Managed Care – PPO | Source: Ambulatory Visit | Attending: Radiation Oncology | Admitting: Radiation Oncology

## 2013-09-09 ENCOUNTER — Encounter: Payer: Self-pay | Admitting: *Deleted

## 2013-09-09 ENCOUNTER — Other Ambulatory Visit: Payer: Self-pay | Admitting: Radiation Oncology

## 2013-09-09 ENCOUNTER — Telehealth: Payer: Self-pay | Admitting: *Deleted

## 2013-09-09 NOTE — Telephone Encounter (Signed)
Received call from Mr,.Joshua Reese requesting refill on his MMW, okay per Dr,murray, called [facility] [place] spoke with pharmacist Jenny Reichmann, [phone], return call to [facility] stating MMW has been refilled  3:47 PM

## 2013-09-10 NOTE — Progress Notes (Signed)
Met with patient during UT appt with Dr. Lisbeth Renshaw.  Pt expressed ongoing throat soreness; I encouraged continued used of Magic Mouthwash.  Pt expressed understanding.  Continuing to navigate as L3 (treatments completed) patient.  Gayleen Orem, RN, BSN, [facility] Head & Neck Oncology Navigator [phone]

## 2013-09-17 NOTE — Progress Notes (Signed)
  Radiation Oncology         [phone]) [phone] ________________________________  Name: Joshua Reese MRN: [id number]  Date: 09/08/2013  DOB: 1955/11/20  End of Treatment Note  Diagnosis:   Squamous cell carcinoma of the larynx     Indication for treatment:  Curative       Radiation treatment dates:   07/30/2013 through 09/08/2013  Site/dose:   The patient was treated to the larynx to a dose of 63 gray in 28 fractions. This was accomplished using a 2 field technique.  Narrative: The patient tolerated radiation treatment relatively well.   The patient had some irritation of the throat. The patient's skin held up relatively well during treatment without substantial moist desquamation. He had significant hoarseness towards the end of treatment.  Plan: The patient has completed radiation treatment. The patient will return to radiation oncology clinic for routine followup in one month. I advised the patient to call or return sooner if they have any questions or concerns related to their recovery or treatment. ________________________________  Jodelle Gross, M.D., Ph.D.

## 2013-10-06 ENCOUNTER — Encounter: Payer: Self-pay | Admitting: Radiation Oncology

## 2013-10-08 ENCOUNTER — Ambulatory Visit
Admission: RE | Admit: 2013-10-08 | Discharge: 2013-10-08 | Disposition: A | Discharge status: Home / Self Care | Payer: BC Managed Care – PPO | Source: Ambulatory Visit | Attending: Radiation Oncology | Admitting: Radiation Oncology

## 2013-10-08 ENCOUNTER — Encounter: Payer: Self-pay | Admitting: Radiation Oncology

## 2013-10-08 VITALS — BP 158/90 | HR 65 | Temp 98.0°F | Resp 20 | Ht 73.0 in | Wt 133.7 lb

## 2013-10-08 DIAGNOSIS — C320 Malignant neoplasm of glottis: Secondary | ICD-10-CM

## 2013-10-08 HISTORY — DX: Personal history of irradiation: Z92.3

## 2013-10-08 NOTE — Progress Notes (Signed)
Follow up s/p rad txs larynx:07/30/13/09/08/13, appetite good, no pain,nor nausea, no dry mouth, no thick saliva, no hoarseness, neck has healed well, only takes mtv daily, energy good 3:41 PM

## 2013-10-10 NOTE — Progress Notes (Signed)
  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  The patient states he is doing much better. His voice has returned and he states that the skin has healed well. No current pain at this time. His energy level is good. He really has no complaints today and is feeling much better.                              ALLERGIES:  has No Known Allergies.  Meds: Current Outpatient Prescriptions  Medication Sig Dispense Refill  . Multiple Vitamin (MULTIVITAMIN) capsule Take 1 capsule by mouth daily.       No current facility-administered medications for this encounter.    Physical Findings: The patient is in no acute distress. Patient is alert and oriented.  height is 6\' 1"  (1.854 m) and weight is 133 lb 11.2 oz (60.646 kg). His oral temperature is 98 F (36.7 C). His blood pressure is 158/90 and his pulse is 65. His respiration is 20. .   The skin looks quite good today. It has healed very well with no ongoing desquamation.  Lab Findings: Lab Results  Component Value Date   HGB 14.8 06/25/2013     Radiographic Findings: No results found.  Impression:    The patient is doing very well 1 month after finishing his radiation treatment. No significant complaints today.  Plan:  Followup in 2 months.   Jodelle Gross, M.D., Ph.D.

## 2013-11-25 ENCOUNTER — Ambulatory Visit
Admission: RE | Admit: 2013-11-25 | Discharge: 2013-11-25 | Disposition: A | Discharge status: Home / Self Care | Payer: BC Managed Care – PPO | Source: Ambulatory Visit | Attending: Radiation Oncology | Admitting: Radiation Oncology

## 2013-11-25 ENCOUNTER — Encounter: Payer: Self-pay | Admitting: Radiation Oncology

## 2013-11-25 VITALS — BP 117/64 | HR 64 | Temp 97.8°F | Resp 20 | Ht 73.0 in | Wt 133.8 lb

## 2013-11-25 DIAGNOSIS — Z923 Personal history of irradiation: Secondary | ICD-10-CM | POA: Insufficient documentation

## 2013-11-25 DIAGNOSIS — C320 Malignant neoplasm of glottis: Secondary | ICD-10-CM

## 2013-11-25 MED ORDER — LARYNGOSCOPY SOLUTION RAD-ONC
15.0000 mL | Freq: Once | TOPICAL | Status: AC
  Administered 2013-11-25: 15 mL via TOPICAL
  Filled 2013-11-25: qty 15

## 2013-11-25 NOTE — Progress Notes (Signed)
Follow up vocal cord, rad txs 07/30/13-09/08/13, last seen Dr.Byers 06/22/13, no c/o difficulty swallowing, eating, no pain, "I'm doing great" no hoarseness 9:24 AM

## 2013-11-25 NOTE — Addendum Note (Signed)
Encounter addended by: Rebecca Eaton, RN on: 11/25/2013  9:56 AM<BR>     Documentation filed: Inpatient Document Flowsheet

## 2013-11-25 NOTE — Progress Notes (Signed)
  Radiation Oncology         [phone]) [phone] ________________________________  Name: Joshua Reese MRN: [id number]  Date: 11/25/2013  DOB: [birth year]  Follow-Up Visit Note  CC: No PCP Per Patient  Melissa Montane, MD  Diagnosis:   This was a carcinoma of the vocal cord  Interval Since Last Radiation:  Two and a half months   Narrative:  The patient returns today for routine follow-up.  The patient states that he is doing very well. No significant complaints today. No dysphasia or odynophagia. The patient indicates that his voice has continued to improve in terms of strength. No pain in the head neck region.                              ALLERGIES:  has No Known Allergies.  Meds: Current Outpatient Prescriptions  Medication Sig Dispense Refill  . Multiple Vitamin (MULTIVITAMIN) capsule Take 1 capsule by mouth daily.       Current Facility-Administered Medications  Medication Dose Route Frequency Provider Last Rate Last Dose  . laryngocopy solution for Rad-Onc  15 mL Topical Once Marye Round, MD        Physical Findings: The patient is in no acute distress. Patient is alert and oriented.  height is 6\' 1"  (1.854 m) and weight is 133 lb 12.8 oz (60.691 kg). His oral temperature is 97.8 F (36.6 C). His blood pressure is 117/64 and his pulse is 64. His respiration is 20. Marland Kitchen   No cervical lymphadenopathy. Oral cavity clear.  Fiberoptic exam: After the use of topical anesthetic, the flexible laryngoscope was passed through the right near. Good visualization was obtained. No lesions or suspicious findings within the larynx, hypopharynx, oropharynx or nasopharynx.   Lab Findings: Lab Results  Component Value Date   HGB 14.8 06/25/2013     Radiographic Findings: No results found.  Impression:    The patient is doing very well clinically. No significant complaints. Fiberoptic exam looked very good.  Plan:  Followup in 4 months.   Jodelle Gross, M.D., Ph.D.

## 2014-03-25 ENCOUNTER — Ambulatory Visit
Admission: RE | Admit: 2014-03-25 | Discharge: 2014-03-25 | Disposition: A | Discharge status: Home / Self Care | Payer: BC Managed Care – PPO | Source: Ambulatory Visit | Attending: Radiation Oncology | Admitting: Radiation Oncology

## 2014-09-28 ENCOUNTER — Other Ambulatory Visit: Payer: Self-pay | Admitting: Otolaryngology

## 2014-09-28 DIAGNOSIS — Z8521 Personal history of malignant neoplasm of larynx: Secondary | ICD-10-CM

## 2014-09-28 DIAGNOSIS — R221 Localized swelling, mass and lump, neck: Secondary | ICD-10-CM

## 2014-10-01 ENCOUNTER — Emergency Department: Payer: Self-pay | Admitting: Emergency Medicine

## 2014-10-04 ENCOUNTER — Ambulatory Visit
Admission: RE | Admit: 2014-10-04 | Discharge: 2014-10-04 | Disposition: A | Discharge status: Home / Self Care | Payer: BLUE CROSS/BLUE SHIELD | Source: Ambulatory Visit | Attending: Otolaryngology | Admitting: Otolaryngology

## 2014-10-04 DIAGNOSIS — Z8521 Personal history of malignant neoplasm of larynx: Secondary | ICD-10-CM

## 2014-10-04 DIAGNOSIS — R221 Localized swelling, mass and lump, neck: Secondary | ICD-10-CM

## 2014-10-04 MED ORDER — IOPAMIDOL (ISOVUE-300) INJECTION 61%
75.0000 mL | Freq: Once | INTRAVENOUS | Status: AC | PRN
  Administered 2014-10-04: 75 mL via INTRAVENOUS

## 2014-10-14 ENCOUNTER — Other Ambulatory Visit: Payer: Self-pay | Admitting: Otolaryngology

## 2014-10-14 ENCOUNTER — Other Ambulatory Visit (HOSPITAL_COMMUNITY)
Admission: RE | Admit: 2014-10-14 | Discharge: 2014-10-14 | Disposition: A | Discharge status: Home / Self Care | Payer: BLUE CROSS/BLUE SHIELD | Source: Ambulatory Visit | Attending: Otolaryngology | Admitting: Otolaryngology

## 2014-10-14 DIAGNOSIS — R221 Localized swelling, mass and lump, neck: Secondary | ICD-10-CM | POA: Insufficient documentation

## 2014-10-25 ENCOUNTER — Other Ambulatory Visit (HOSPITAL_COMMUNITY): Payer: Self-pay | Admitting: Otolaryngology

## 2014-10-25 DIAGNOSIS — R221 Localized swelling, mass and lump, neck: Secondary | ICD-10-CM

## 2014-11-02 ENCOUNTER — Encounter (HOSPITAL_COMMUNITY)
Admission: RE | Admit: 2014-11-02 | Discharge: 2014-11-02 | Disposition: A | Discharge status: Home / Self Care | Payer: BLUE CROSS/BLUE SHIELD | Source: Ambulatory Visit | Attending: Otolaryngology | Admitting: Otolaryngology

## 2014-11-02 DIAGNOSIS — R221 Localized swelling, mass and lump, neck: Secondary | ICD-10-CM | POA: Insufficient documentation

## 2014-11-02 LAB — GLUCOSE, CAPILLARY: Glucose-Capillary: 84 mg/dL (ref 70–99)

## 2014-11-02 MED ORDER — FLUDEOXYGLUCOSE F - 18 (FDG) INJECTION
6.6800 | Freq: Once | INTRAVENOUS | Status: AC | PRN
  Administered 2014-11-02: 6.68 via INTRAVENOUS

## 2015-11-14 DEATH — deceased

## 2016-02-28 IMAGING — CT NM PET TUM IMG INITIAL (PI) SKULL BASE T - THIGH
1 of 7 series · 1 of 25 positions shown · non-contrast
Comparison: None.

CLINICAL DATA: Initial treatment strategy for neck mass..

EXAM:
NUCLEAR MEDICINE PET SKULL BASE TO THIGH
TECHNIQUE: 6.6 mCi F-18 FDG was injected intravenously. Full-ring PET imaging
was performed from the skull base to thigh after the radiotracer. CT
data was obtained and used for attenuation correction and anatomic
localization.
FASTING BLOOD GLUCOSE:  Value: 84 mg/dl

[Series 4: ct hn_sk_th 5.0 hd_fov · axial · 5.0mm · 1.15mm/px · 1 of 245 slices shown]
[im 1/245  brain]
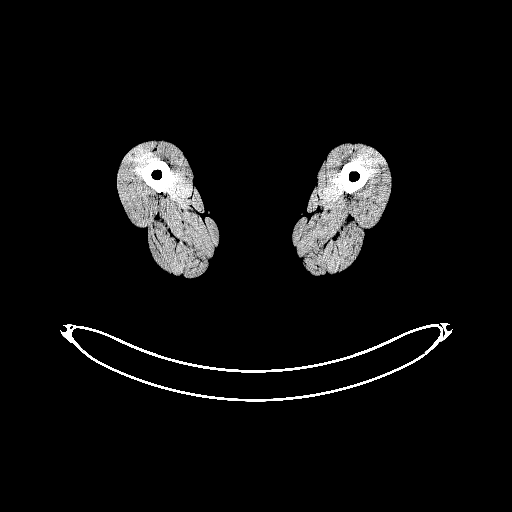

[1 of 25 positions shown; findings below may reference images not displayed]

FINDINGS: NECK

Large, necrotic nodal mass at the junction between level 3 and 4 on
the right is again noted. The SUV max associated with this mass is
equal to 11.1. Hypermetabolic tumor thrombus within the right
internal jugular vein is also noted with an SUV max equal to 7.6.

CHEST

No hypermetabolic mediastinal or hilar nodes. No suspicious
pulmonary nodules on the CT scan.

ABDOMEN/PELVIS

No abnormal hypermetabolic activity within the liver, pancreas,
adrenal glands, or spleen. Calcified granulomas are noted within the
liver and spleen. No hypermetabolic lymph nodes in the abdomen or
pelvis.

SKELETON

No focal hypermetabolic activity to suggest skeletal metastasis.
IMPRESSION: 1. Malignant range FDG uptake is associated with the large necrotic
nodal mass at the junction between the right level 3 and level 4
cervical lymph node chains.
2. Evidence of hypermetabolic tumor thrombus within the right
internal jugular vein.
3. No evidence for distant metastatic disease.
